# Patient Record
Sex: Male | Born: 1990 | Race: Black or African American | Hispanic: No | Marital: Single | Smoking: Never smoker
Health system: Southern US, Community
[De-identification: ages and names within clinical notes are randomized; demographics above are authoritative.]

---
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
## 2014-09-09 ENCOUNTER — Encounter (HOSPITAL_COMMUNITY): Payer: Self-pay | Admitting: Emergency Medicine

## 2014-09-09 ENCOUNTER — Emergency Department (INDEPENDENT_AMBULATORY_CARE_PROVIDER_SITE_OTHER)
Admission: EM | Admit: 2014-09-09 | Discharge: 2014-09-09 | Disposition: A | Discharge status: Home / Self Care | Payer: Self-pay | Source: Home / Self Care | Attending: Family Medicine | Admitting: Family Medicine

## 2014-09-09 ENCOUNTER — Ambulatory Visit (HOSPITAL_COMMUNITY): Payer: Self-pay | Attending: Family Medicine

## 2014-09-09 DIAGNOSIS — J189 Pneumonia, unspecified organism: Secondary | ICD-10-CM

## 2014-09-09 DIAGNOSIS — R0602 Shortness of breath: Secondary | ICD-10-CM | POA: Insufficient documentation

## 2014-09-09 DIAGNOSIS — R079 Chest pain, unspecified: Secondary | ICD-10-CM | POA: Insufficient documentation

## 2014-09-09 DIAGNOSIS — R918 Other nonspecific abnormal finding of lung field: Secondary | ICD-10-CM | POA: Insufficient documentation

## 2014-09-09 MED ORDER — CEFTRIAXONE SODIUM 1 G IJ SOLR
INTRAMUSCULAR | Status: AC
  Filled 2014-09-09: qty 10

## 2014-09-09 MED ORDER — AZITHROMYCIN 250 MG PO TABS: ORAL_TABLET | ORAL | Status: DC

## 2014-09-09 MED ORDER — AZITHROMYCIN 250 MG PO TABS
ORAL_TABLET | ORAL | Status: AC
  Filled 2014-09-09: qty 4

## 2014-09-09 MED ORDER — LIDOCAINE HCL (PF) 1 % IJ SOLN
INTRAMUSCULAR | Status: AC
  Filled 2014-09-09: qty 5

## 2014-09-09 MED ORDER — AZITHROMYCIN 250 MG PO TABS
1000.0000 mg | ORAL_TABLET | Freq: Once | ORAL | Status: AC
  Administered 2014-09-09: 1000 mg via ORAL

## 2014-09-09 MED ORDER — CEFTRIAXONE SODIUM 1 G IJ SOLR
1.0000 g | Freq: Once | INTRAMUSCULAR | Status: AC
  Administered 2014-09-09: 1 g via INTRAMUSCULAR

## 2014-09-09 NOTE — ED Notes (Signed)
Pt  Reports   Symptoms  of  Cough  /  Congested   With  Headache  And  Sinus  Drainage    With  Symptoms  For  About  10  Days   -  He  Is  Sitting  Upright omn the  Exam table  And  He  Is  Speaking in  Complete  sentances      And  Seems  In no  Severe   Distress

## 2014-09-09 NOTE — ED Provider Notes (Addendum)
CSN: [id number]     Arrival date & time 09/09/14  1338 History   First MD Initiated Contact with Patient 09/09/14 1418     Chief Complaint  Patient presents with  . URI   (Consider location/radiation/quality/duration/timing/severity/associated sxs/prior Treatment) Patient is a 23 y.o. male presenting with URI. The history is provided by the patient.  URI Presenting symptoms: cough, fatigue and fever   Presenting symptoms: no rhinorrhea and no sore throat   Severity:  Moderate Onset quality:  Gradual Duration:  10 days Timing:  Constant Progression:  Worsening Chronicity:  New Relieved by:  None tried Worsened by:  Nothing tried Ineffective treatments:  None tried Associated symptoms: no wheezing   Risk factors: no sick contacts     History reviewed. No pertinent past medical history. History reviewed. No pertinent past surgical history. History reviewed. No pertinent family history. History  Substance Use Topics  . Smoking status: Never Smoker   . Smokeless tobacco: Not on file  . Alcohol Use: Yes    Review of Systems  Constitutional: Positive for fever and fatigue.  HENT: Negative.  Negative for rhinorrhea and sore throat.   Respiratory: Positive for cough. Negative for wheezing.     Allergies  Review of patient's allergies indicates no known allergies.  Home Medications   Prior to Admission medications   Medication Sig Start Date End Date Taking? Authorizing Provider  azithromycin (ZITHROMAX Z-PAK) 250 MG tablet Take as directed on pack, start on thurs, take all of med. 09/09/14   Linna Hoff, MD   BP 126/78  Pulse 78  Temp(Src) 99.2 F (37.3 C) (Oral)  Resp 14  SpO2 100% Physical Exam  Nursing note and vitals reviewed. Constitutional: He is oriented to person, place, and time. He appears well-developed and well-nourished. No distress.  HENT:  Right Ear: External ear normal.  Left Ear: External ear normal.  Mouth/Throat: Oropharynx is clear and moist.   Neck: Normal range of motion. Neck supple.  Cardiovascular: Normal rate, regular rhythm and normal heart sounds.   Pulmonary/Chest: Effort normal. He has no wheezes. He has rales. He exhibits no tenderness.  Lymphadenopathy:    He has no cervical adenopathy.  Neurological: He is alert and oriented to person, place, and time.    ED Course  Procedures (including critical care time) Labs Review Labs Reviewed - No data to display  Imaging Review Dg Chest 2 View  09/09/2014   CLINICAL DATA:  Chest pain and shortness of breath.  EXAM: CHEST  2 VIEW  COMPARISON:  None.  FINDINGS: There is a small patchy area of infiltrate at the right lung base posteriorly. The lungs are otherwise clear. Heart size and vascularity are normal. No osseous abnormality.  IMPRESSION: Small patchy infiltrate at the right lung base posteriorly.   Electronically Signed   By: Geanie Cooley M.D.   On: 09/09/2014 15:49     MDM   1. CAP (community acquired pneumonia)        Linna Hoff, MD 09/09/14 1609  Linna Hoff, MD 09/09/14 [phone]

## 2015-04-01 IMAGING — CR DG CHEST 2V
2 series · 2 of 2 positions shown · non-contrast
Comparison: None.

CLINICAL DATA: Chest pain and shortness of breath.

EXAM:
CHEST  2 VIEW

[w chest pa]
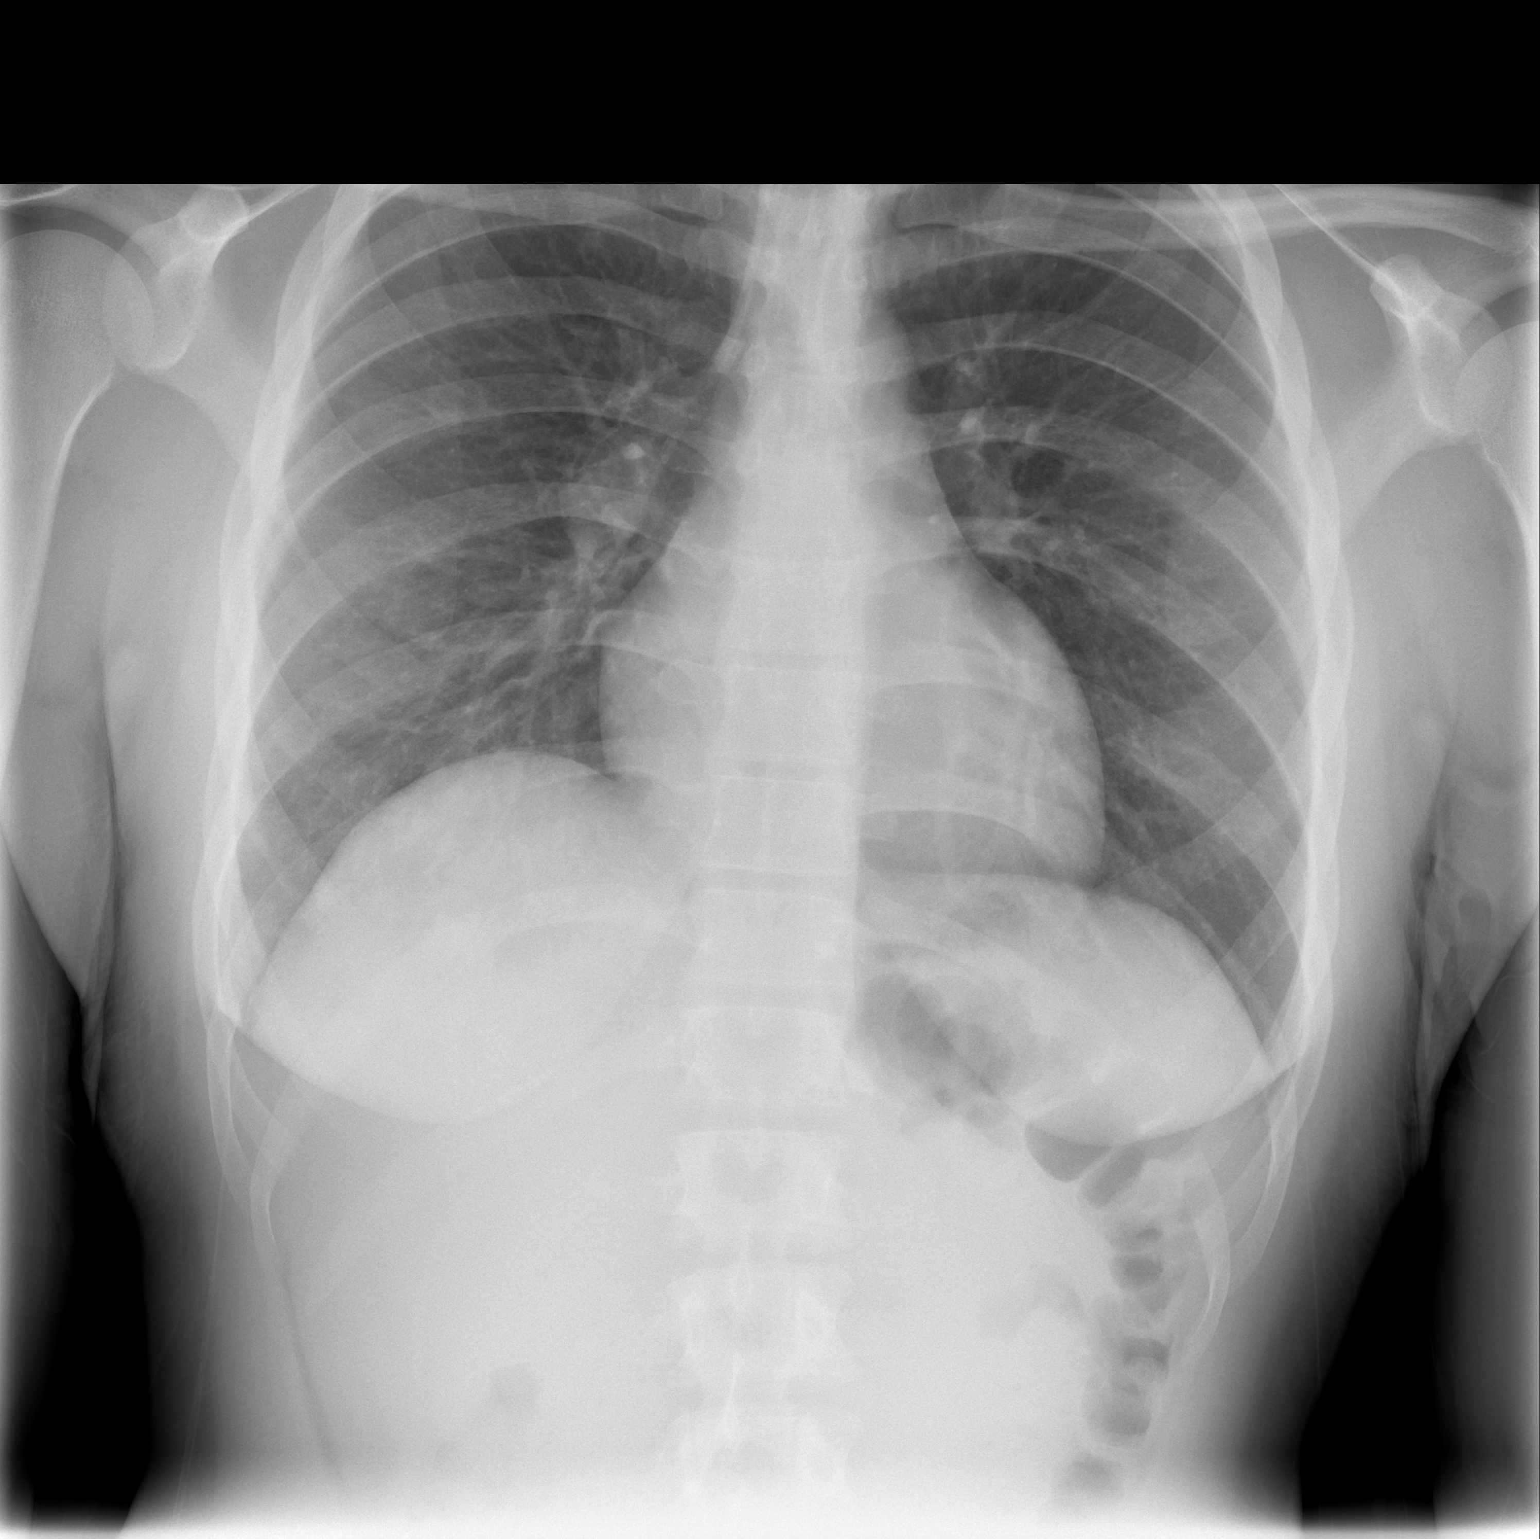

[w chest lat]
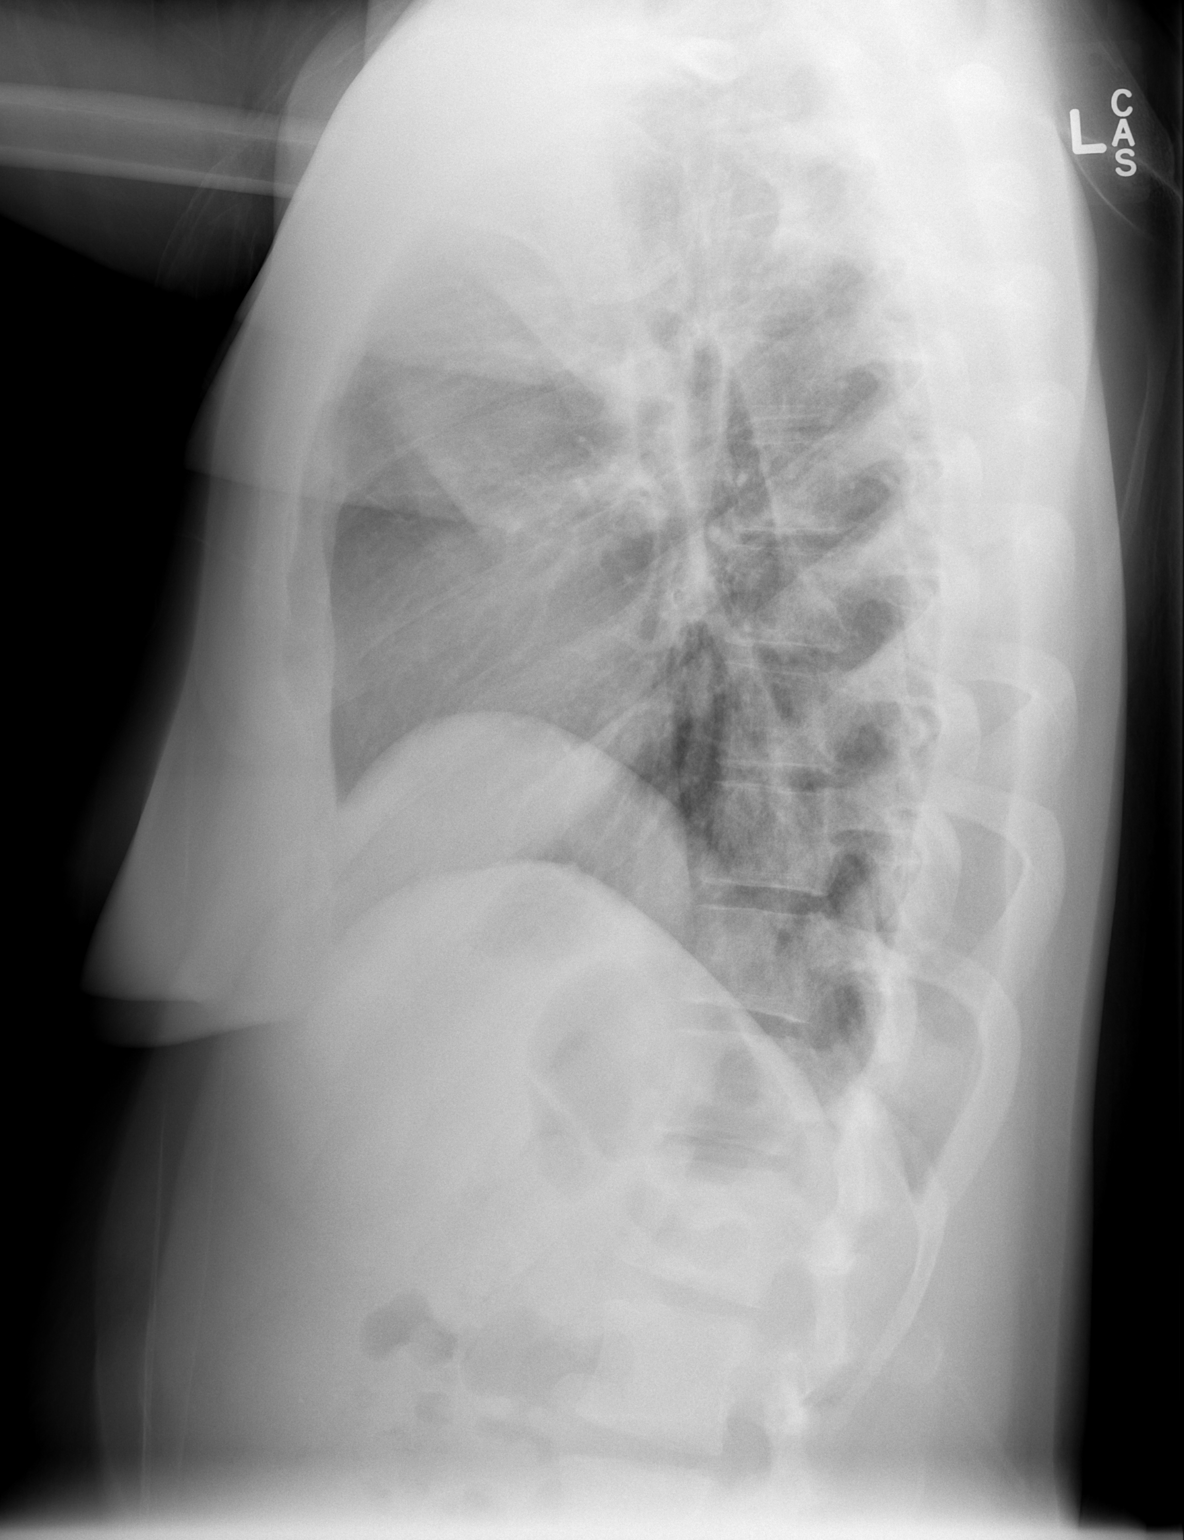

[2 of 2 positions shown; findings below may reference images not displayed]

FINDINGS: There is a small patchy area of infiltrate at the right lung base
posteriorly. The lungs are otherwise clear. Heart size and
vascularity are normal. No osseous abnormality.
IMPRESSION: Small patchy infiltrate at the right lung base posteriorly.

## 2017-03-24 ENCOUNTER — Encounter (HOSPITAL_COMMUNITY): Payer: Self-pay | Admitting: Emergency Medicine

## 2017-03-24 ENCOUNTER — Ambulatory Visit (HOSPITAL_COMMUNITY)
Admission: EM | Admit: 2017-03-24 | Discharge: 2017-03-24 | Disposition: A | Discharge status: Home / Self Care | Payer: PRIVATE HEALTH INSURANCE | Attending: Internal Medicine | Admitting: Internal Medicine

## 2017-03-24 DIAGNOSIS — B9789 Other viral agents as the cause of diseases classified elsewhere: Secondary | ICD-10-CM

## 2017-03-24 DIAGNOSIS — J069 Acute upper respiratory infection, unspecified: Secondary | ICD-10-CM | POA: Diagnosis not present

## 2017-03-24 DIAGNOSIS — T169XXA Foreign body in ear, unspecified ear, initial encounter: Secondary | ICD-10-CM

## 2017-03-24 MED ORDER — BENZONATATE 100 MG PO CAPS: 100.0000 mg | ORAL_CAPSULE | Freq: Three times a day (TID) | ORAL | 0 refills | Status: AC

## 2017-03-24 MED ORDER — IPRATROPIUM BROMIDE 0.06 % NA SOLN: 2.0000 | Freq: Four times a day (QID) | NASAL | 12 refills | Status: AC

## 2017-03-24 NOTE — ED Provider Notes (Signed)
CSN: [id number]     Arrival date & time 03/24/17  1702 History   First MD Initiated Contact with Patient 03/24/17 1859     Chief Complaint  Patient presents with  . URI   (Consider location/radiation/quality/duration/timing/severity/associated sxs/prior Treatment) 26 year old male presents to clinic with a chief complaint of URI type symptoms ongoing for 1 week.   The history is provided by the patient.  URI  Presenting symptoms: congestion, cough, ear pain, fatigue, fever, rhinorrhea and sore throat   Congestion:    Location:  Nasal   Interferes with sleep: no   Rhinorrhea:    Quality:  Clear   Severity:  Moderate   Duration:  4 days   Timing:  Constant   Progression:  Unchanged Severity:  Moderate Onset quality:  Gradual Duration:  2 days Timing:  Intermittent Progression:  Improving Chronicity:  New Relieved by:  None tried Ineffective treatments:  None tried Associated symptoms: myalgias and sneezing   Associated symptoms: no headaches, no neck pain, no sinus pain, no swollen glands and no wheezing     History reviewed. No pertinent past medical history. History reviewed. No pertinent surgical history. History reviewed. No pertinent family history. Social History  Substance Use Topics  . Smoking status: Never Smoker  . Smokeless tobacco: Never Used  . Alcohol use Yes    Review of Systems  Constitutional: Positive for fatigue and fever. Negative for chills.  HENT: Positive for congestion, ear pain, rhinorrhea, sneezing and sore throat. Negative for ear discharge and sinus pain.   Respiratory: Positive for cough. Negative for wheezing.   Cardiovascular: Negative for chest pain and palpitations.  Gastrointestinal: Positive for nausea. Negative for abdominal pain, diarrhea and vomiting.  Musculoskeletal: Positive for myalgias. Negative for neck pain and neck stiffness.  Skin: Negative.   Neurological: Negative for weakness, light-headedness and headaches.     Allergies  Patient has no known allergies.  Home Medications   Prior to Admission medications   Medication Sig Start Date End Date Taking? Authorizing Provider  benzonatate (TESSALON) 100 MG capsule Take 1 capsule (100 mg total) by mouth every 8 (eight) hours. 03/24/17   Dorena Bodo, NP  ipratropium (ATROVENT) 0.06 % nasal spray Place 2 sprays into both nostrils 4 (four) times daily. 03/24/17   Dorena Bodo, NP   Meds Ordered and Administered this Visit  Medications - No data to display  BP 136/75 (BP Location: Left Arm)   Pulse 98   Temp 99.9 F (37.7 C) (Oral)   Resp 14   SpO2 97%  No data found.   Physical Exam  Constitutional: He is oriented to person, place, and time. He appears well-developed and well-nourished. No distress.  HENT:  Head: Normocephalic and atraumatic.  Right Ear: External ear normal. A foreign body is present.  Left Ear: External ear normal. A foreign body is present.  Nose: Rhinorrhea present. Right sinus exhibits no maxillary sinus tenderness and no frontal sinus tenderness. Left sinus exhibits no maxillary sinus tenderness and no frontal sinus tenderness.  Mouth/Throat: Uvula is midline and oropharynx is clear and moist. No oropharyngeal exudate.  Bilateral foreign bodies found in each ear. 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  Eyes: Pupils are equal, round, and reactive to light.  Neck: Normal range of motion. Neck supple. No JVD present.  Cardiovascular: Normal rate and regular rhythm.   Pulmonary/Chest: Effort normal and breath sounds normal. No respiratory distress. He  has no wheezes.  Abdominal: Soft. Bowel sounds are normal.  Lymphadenopathy:       Head (right side): No submental, no submandibular, no tonsillar and no preauricular adenopathy present.       Head (left side): No submental, no submandibular, no tonsillar and no preauricular adenopathy present.    He has no cervical  adenopathy.  Neurological: He is alert and oriented to person, place, and time.  Skin: Skin is warm and dry. Capillary refill takes less than 2 seconds. No rash noted. He is not diaphoretic. No erythema.  Psychiatric: He has a normal mood and affect.  Nursing note and vitals reviewed.   Urgent Care Course     Procedures (including critical care time)  Labs Review Labs Reviewed - No data to display  Imaging Review No results found.      MDM   1. Viral upper respiratory tract infection   2. Foreign body in ear, unspecified laterality, initial encounter    For viral URI, patient was given prescription for Tessalon, and ipratropium nasal spray. Provided counseling on over-the-counter therapies for symptom management. Recommend following up with primary care returning to clinic if symptoms persist past one week.  With regard to the foreign body in each ear, states these cotton balls of been present since he was 18, and he has never sought medical care for their removal as he felt "surgery would be necessary" alligator forceps were  used to remove the foreign bodies, tympanic membrane remains intact with no signs of trauma.     Dorena Bodo, NP 03/24/17 [phone]

## 2017-03-24 NOTE — ED Triage Notes (Signed)
Pt c/o cold sx onset: 4 days  Sx include: nasal drainage/congestion, cough, sneezing, HA, fevers  Taking: OTC cold meds w/temp relief.   A&O x4... NAD

## 2017-03-24 NOTE — Discharge Instructions (Signed)
You most likely have a viral URI, I advise rest, plenty of fluids and management of symptoms with over the counter medicines. For symptoms you may take Tylenol as needed every 4-6 hours for body aches or fever, not to exceed 4,000 mg a day, Take mucinex or mucinex DM ever 12 hours with a full glass of water, you may use an inhaled steroid such as Flonase, 2 sprays each nostril once a day for congestion, or an antihistamine such as Claritin or Zyrtec once a day. For cough, I have prescribed a medication called Tessalon. Take 1 tablet every 8 hours as needed for your cough. For runny nose, I have prescribed ipratropium nasal spray, due to sprays each nostril up to 4 times a day. Should your symptoms worsen or fail to resolve, follow up with your primary care provider or return to clinic.

## 2022-01-27 ENCOUNTER — Encounter (HOSPITAL_COMMUNITY): Payer: Self-pay

## 2022-01-27 ENCOUNTER — Emergency Department (HOSPITAL_COMMUNITY)
Admission: EM | Admit: 2022-01-27 | Discharge: 2022-01-28 | Disposition: A | Discharge status: Home / Self Care | Payer: PRIVATE HEALTH INSURANCE | Attending: Emergency Medicine | Admitting: Emergency Medicine

## 2022-01-27 ENCOUNTER — Emergency Department (HOSPITAL_COMMUNITY): Payer: PRIVATE HEALTH INSURANCE

## 2022-01-27 DIAGNOSIS — R002 Palpitations: Secondary | ICD-10-CM | POA: Insufficient documentation

## 2022-01-27 LAB — TROPONIN I (HIGH SENSITIVITY)
Troponin I (High Sensitivity): 4 ng/L (ref <18)
Troponin I (High Sensitivity): 3 ng/L (ref <18)

## 2022-01-27 LAB — BASIC METABOLIC PANEL
Anion gap: 6 (ref 5–15)
BUN: 12 mg/dL (ref 6–20)
CO2: 29 mmol/L (ref 22–32)
Calcium: 9.4 mg/dL (ref 8.9–10.3)
Chloride: 103 mmol/L (ref 98–111)
Creatinine, Ser: 0.95 mg/dL (ref 0.61–1.24)
GFR, Estimated: >60 mL/min (ref >60)
Glucose, Bld: 98 mg/dL (ref 70–99)
Potassium: 3.8 mmol/L (ref 3.5–5.1)
Sodium: 138 mmol/L (ref 135–145)

## 2022-01-27 LAB — TSH: TSH: 0.793 u[IU]/mL (ref 0.350–4.500)

## 2022-01-27 LAB — CBC
HCT: 50.9 % (ref 39.0–52.0)
Hemoglobin: 17.1 g/dL — ABNORMAL HIGH (ref 13.0–17.0)
MCH: 31.6 pg (ref 26.0–34.0)
MCHC: 33.6 g/dL (ref 30.0–36.0)
MCV: 94.1 fL (ref 80.0–100.0)
Platelets: 168 10*3/uL (ref 150–400)
RBC: 5.41 MIL/uL (ref 4.22–5.81)
RDW: 13.8 % (ref 11.5–15.5)
WBC: 4.6 10*3/uL (ref 4.0–10.5)
nRBC: 0 % (ref 0.0–0.2)

## 2022-01-27 NOTE — Discharge Instructions (Signed)
Try to cut back on the amount of caffeine you drink each day.  If your palpitations continue, call cardiology office listed above for follow-up.

## 2022-01-27 NOTE — ED Provider Notes (Signed)
[facility] DEPT Provider Note   CSN: [id number] Arrival date & time: 01/27/22  1827     History  Chief Complaint  Patient presents with   Palpitations    Gary Palmer is a 31 y.o. male.  Patient presents to the emergency department with intermittent heart palpitations.  Patient reports that intermittently he will feel like his heart is beating faster and harder than normally.  He has not identified anything that causes the situation.  It usually last for about an hour and then resolves.  He does drink a lot of caffeine drinks.  He does not have any associated chest pain when it occurs.  No shortness of breath.      Home Medications Prior to Admission medications   Medication Sig Start Date End Date Taking? Authorizing Provider  benzonatate (TESSALON) 100 MG capsule Take 1 capsule (100 mg total) by mouth every 8 (eight) hours. 03/24/17   Barnet Glasgow, NP  ipratropium (ATROVENT) 0.06 % nasal spray Place 2 sprays into both nostrils 4 (four) times daily. 03/24/17   Barnet Glasgow, NP      Allergies    Patient has no known allergies.    Review of Systems   Review of Systems  Cardiovascular:  Positive for palpitations.   Physical Exam Updated Vital Signs BP 136/77 (BP Location: Right Arm)    Pulse (!) 51    Temp 98.5 F (36.9 C) (Oral)    Resp 16    Ht 5\' 10"  (1.778 m)    Wt 79.4 kg    SpO2 100%    BMI 25.11 kg/m  Physical Exam Vitals and nursing note reviewed.  Constitutional:      General: He is not in acute distress.    Appearance: He is well-developed.  HENT:     Head: Normocephalic and atraumatic.  Eyes:     Conjunctiva/sclera: Conjunctivae normal.  Cardiovascular:     Rate and Rhythm: Normal rate and regular rhythm.     Heart sounds: No murmur heard. Pulmonary:     Effort: Pulmonary effort is normal. No respiratory distress.     Breath sounds: Normal breath sounds.  Abdominal:     Palpations: Abdomen is soft.      Tenderness: There is no abdominal tenderness.  Musculoskeletal:        General: No swelling.     Cervical back: Neck supple.  Skin:    General: Skin is warm and dry.     Capillary Refill: Capillary refill takes less than 2 seconds.  Neurological:     Mental Status: He is alert.  Psychiatric:        Mood and Affect: Mood normal.    ED Results / Procedures / Treatments   Labs (all labs ordered are listed, but only abnormal results are displayed) Labs Reviewed  CBC - Abnormal; Notable for the following components:      Result Value   Hemoglobin 17.1 (*)    All other components within normal limits  BASIC METABOLIC PANEL  TSH  TROPONIN I (HIGH SENSITIVITY)  TROPONIN I (HIGH SENSITIVITY)    EKG EKG Interpretation  Date/Time:  Thursday January 27 2022 18:43:27 EST Ventricular Rate:  83 PR Interval:  130 QRS Duration: 85 QT Interval:  351 QTC Calculation: 413 R Axis:   90 Text Interpretation: Sinus rhythm Borderline right axis deviation ST elev, probable normal early repol pattern No old tracing to compare Confirmed by Daleen Bo [phone]) on 01/27/2022 9:55:51 PM  Radiology DG Chest 2 View  Result Date: 01/27/2022 CLINICAL DATA:  Cardiac palpitations. EXAM: CHEST - 2 VIEW COMPARISON:  09/09/2014 FINDINGS: The lungs appear clear. Cardiac and mediastinal contours normal. No pleural effusion identified. No substantial bony abnormality observed. IMPRESSION: No significant abnormality identified. Electronically Signed   By: Van Clines M.D.   On: 01/27/2022 20:16    Procedures Procedures    Medications Ordered in ED Medications - No data to display  ED Course/ Medical Decision Making/ A&P                           Medical Decision Making  31 year old male with no significant past medical history presents with palpitations.  No associated chest pain or shortness of breath.  Episodes occur intermittently without identified cause.  EKG shows sinus rhythm.  No  arrhythmia noted during visit.  Electrolytes normal.  Work-up completely normal including TSH.  Patient does seem to drink a lot of caffeine, recommended he cut back on caffeine.  If this does not help, refer to cardiology for long-term monitoring.        Final Clinical Impression(s) / ED Diagnoses Final diagnoses:  Heart palpitations    Rx / DC Orders ED Discharge Orders          Ordered    Ambulatory referral to Cardiology        01/27/22 2332              Orpah Greek, MD 01/27/22 2333

## 2022-01-27 NOTE — ED Triage Notes (Signed)
Pt presents with c/o palpitations. Pt reports that he has been feeling like his heart is not beating right for approx 3 weeks. Pt denies any pain.

## 2022-01-27 NOTE — ED Provider Triage Note (Signed)
Emergency Medicine Provider Triage Evaluation Note  Gary Palmer , a 31 y.o. male  was evaluated in triage.  Pt complains of palpitations of 3-week duration.  Denies chest pain, shortness of breath.  Denies recent illness..  Review of Systems  Positive: As above Negative: As above  Physical Exam  BP (!) 137/97 (BP Location: Right Arm)    Pulse 76    Temp 98.5 F (36.9 C) (Oral)    Resp 16    SpO2 100%  Gen:   Awake, no distress   Resp:  Normal effort  MSK:   Moves extremities without difficulty  Other:   Medical Decision Making  Medically screening exam initiated at 6:45 PM.  Appropriate orders placed.  Gary Palmer was informed that the remainder of the evaluation will be completed by another provider, this initial triage assessment does not replace that evaluation, and the importance of remaining in the ED until their evaluation is complete.     Gary [place], PA-C 01/27/22 1845

## 2022-02-14 ENCOUNTER — Ambulatory Visit: Payer: PRIVATE HEALTH INSURANCE | Admitting: Cardiology

## 2022-03-09 ENCOUNTER — Ambulatory Visit: Payer: Self-pay | Admitting: Cardiology
